# Patient Record
Sex: Male | Born: 1980 | Race: White | Hispanic: No | Marital: Married | Smoking: Current every day smoker
Health system: Southern US, Community
[De-identification: ages and names within clinical notes are randomized; demographics above are authoritative.]

## PROBLEM LIST (undated history)

## (undated) HISTORY — PX: BRAIN AVM REPAIR: SHX202

---

## 2003-09-20 ENCOUNTER — Emergency Department (HOSPITAL_COMMUNITY): Admission: EM | Admit: 2003-09-20 | Discharge: 2003-09-20 | Payer: Self-pay | Admitting: Emergency Medicine

## 2004-03-01 ENCOUNTER — Emergency Department (HOSPITAL_COMMUNITY): Admission: EM | Admit: 2004-03-01 | Discharge: 2004-03-01 | Payer: Self-pay | Admitting: Emergency Medicine

## 2004-06-13 ENCOUNTER — Ambulatory Visit (HOSPITAL_COMMUNITY): Admission: RE | Admit: 2004-06-13 | Discharge: 2004-06-13 | Payer: Self-pay | Admitting: Family Medicine

## 2004-06-25 ENCOUNTER — Emergency Department (HOSPITAL_COMMUNITY): Admission: EM | Admit: 2004-06-25 | Discharge: 2004-06-25 | Payer: Self-pay | Admitting: Emergency Medicine

## 2004-07-07 ENCOUNTER — Ambulatory Visit: Payer: Self-pay | Admitting: Orthopedic Surgery

## 2004-07-25 ENCOUNTER — Encounter (HOSPITAL_COMMUNITY): Admission: RE | Admit: 2004-07-25 | Discharge: 2004-08-24 | Payer: Self-pay | Admitting: Orthopedic Surgery

## 2004-08-13 ENCOUNTER — Emergency Department (HOSPITAL_COMMUNITY): Admission: EM | Admit: 2004-08-13 | Discharge: 2004-08-13 | Payer: Self-pay | Admitting: Emergency Medicine

## 2004-11-06 ENCOUNTER — Emergency Department (HOSPITAL_COMMUNITY): Admission: EM | Admit: 2004-11-06 | Discharge: 2004-11-06 | Payer: Self-pay | Admitting: Emergency Medicine

## 2005-03-22 ENCOUNTER — Emergency Department (HOSPITAL_COMMUNITY): Admission: EM | Admit: 2005-03-22 | Discharge: 2005-03-22 | Payer: Self-pay | Admitting: Emergency Medicine

## 2006-06-19 ENCOUNTER — Emergency Department (HOSPITAL_COMMUNITY): Admission: EM | Admit: 2006-06-19 | Discharge: 2006-06-19 | Payer: Self-pay | Admitting: Emergency Medicine

## 2006-08-19 ENCOUNTER — Emergency Department (HOSPITAL_COMMUNITY): Admission: EM | Admit: 2006-08-19 | Discharge: 2006-08-19 | Payer: Self-pay | Admitting: Emergency Medicine

## 2006-10-21 ENCOUNTER — Emergency Department (HOSPITAL_COMMUNITY): Admission: EM | Admit: 2006-10-21 | Discharge: 2006-10-21 | Payer: Self-pay | Admitting: Emergency Medicine

## 2007-08-19 ENCOUNTER — Encounter: Payer: Self-pay | Admitting: Orthopedic Surgery

## 2007-09-02 ENCOUNTER — Encounter: Payer: Self-pay | Admitting: Orthopedic Surgery

## 2008-12-09 ENCOUNTER — Emergency Department (HOSPITAL_COMMUNITY): Admission: EM | Admit: 2008-12-09 | Discharge: 2008-12-09 | Payer: Self-pay | Admitting: Emergency Medicine

## 2009-09-13 ENCOUNTER — Emergency Department (HOSPITAL_COMMUNITY): Admission: EM | Admit: 2009-09-13 | Discharge: 2009-09-13 | Payer: Self-pay | Admitting: Emergency Medicine

## 2010-04-24 ENCOUNTER — Encounter: Payer: Self-pay | Admitting: Family Medicine

## 2010-10-12 IMAGING — CT CT HEAD W/O CM
1 series · 16 of 30 positions shown, 20 images · non-contrast
Comparison: CT head 11/06/2004.

CLINICAL DATA: Right-sided headache.  History of AV malformation
and repair.  Seizure disorder.

CT HEAD WITHOUT CONTRAST
TECHNIQUE: Contiguous axial images were obtained from the base of
the skull through the vertex without contrast.

[Series 2: headseq 4.8 h37s · axial · 0.46mm/px · z∈[+110,+265]mm · 16 of 36 slices shown, 20 images]
[im 2/36  brain]
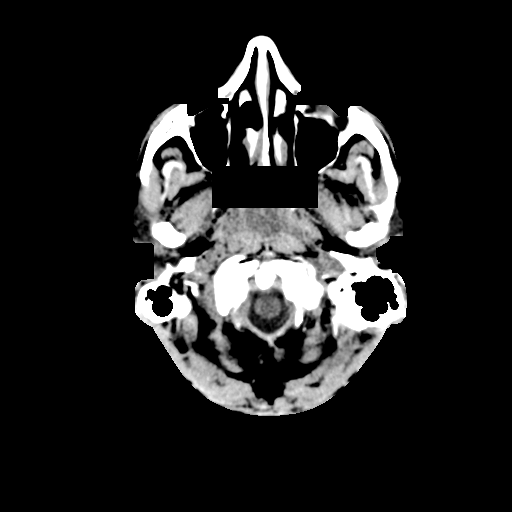
[im 2/36  bone]
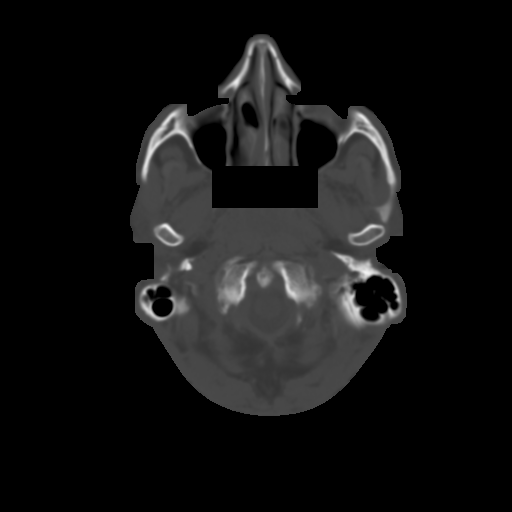
[im 4/36  brain]
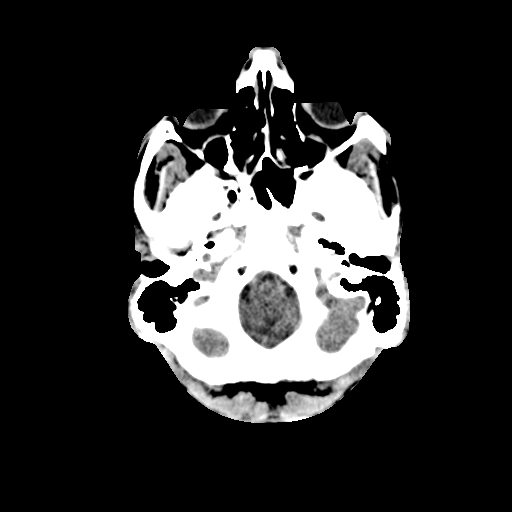
[im 7/36  brain]
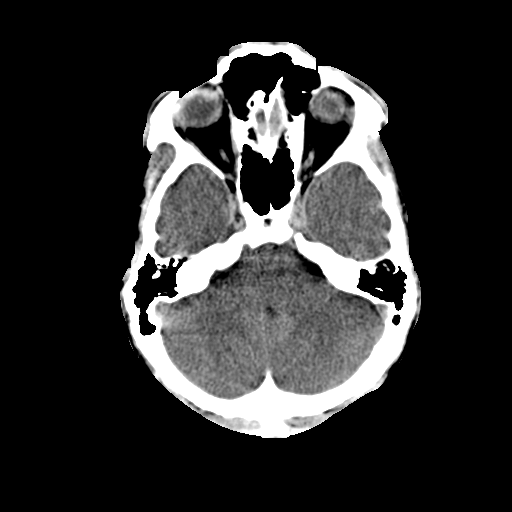
[im 9/36  brain]
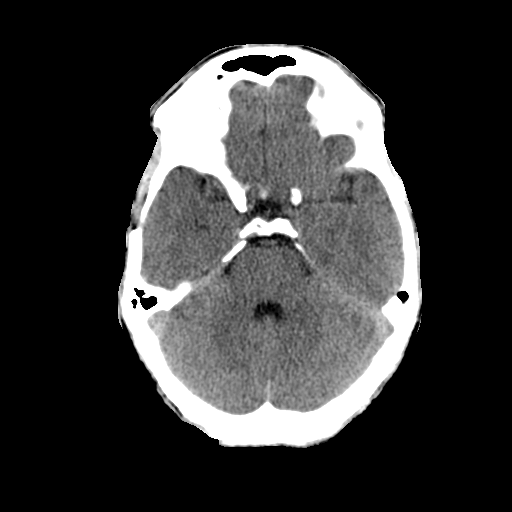
[im 10/36  brain]
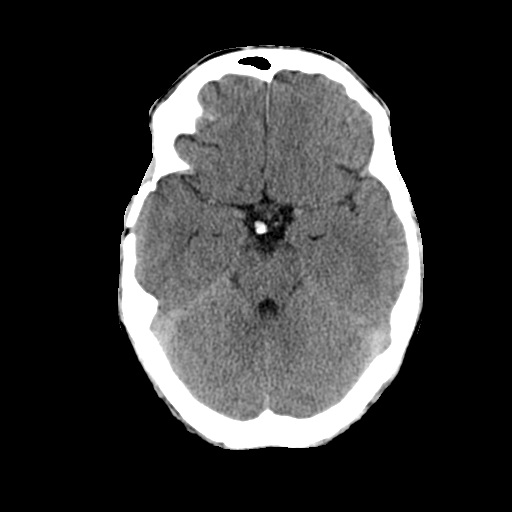
[im 10/36  bone]
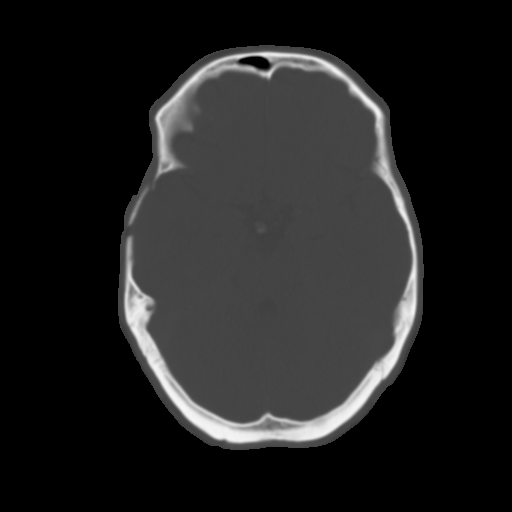
[im 13/36  brain]
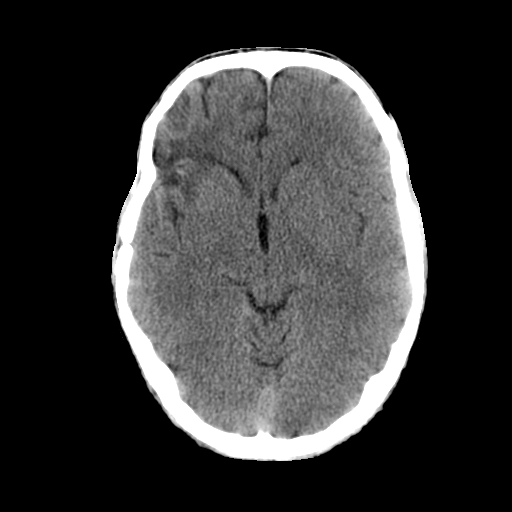
[im 15/36  brain]
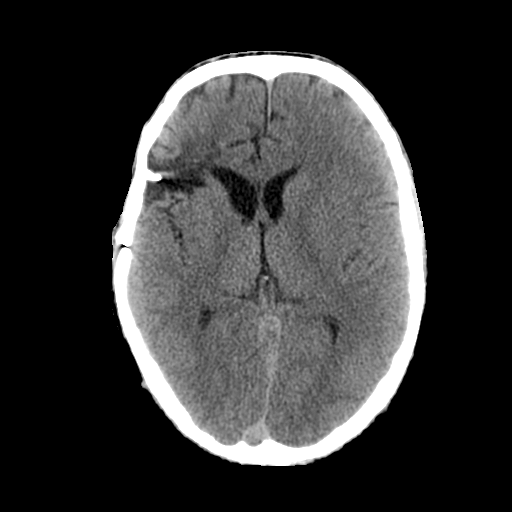
[im 17/36  brain]
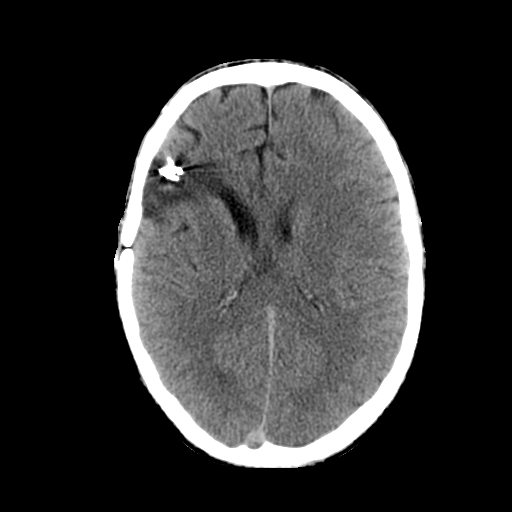
[im 19/36  brain]
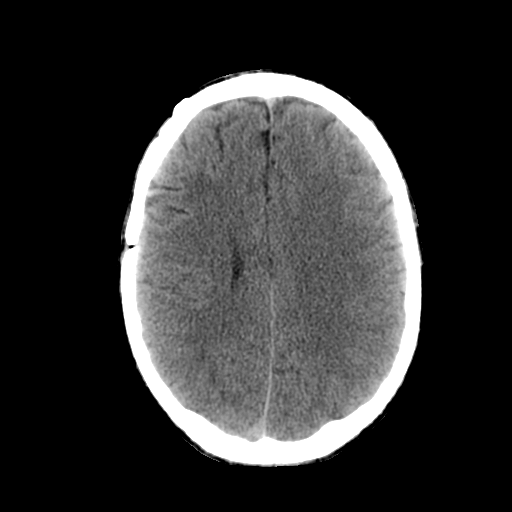
[im 19/36  bone]
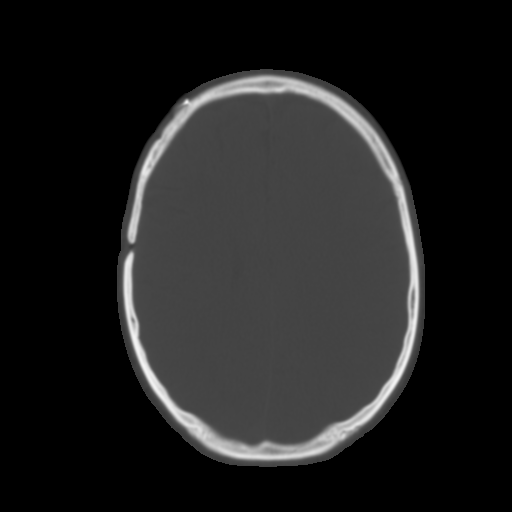
[im 21/36  brain]
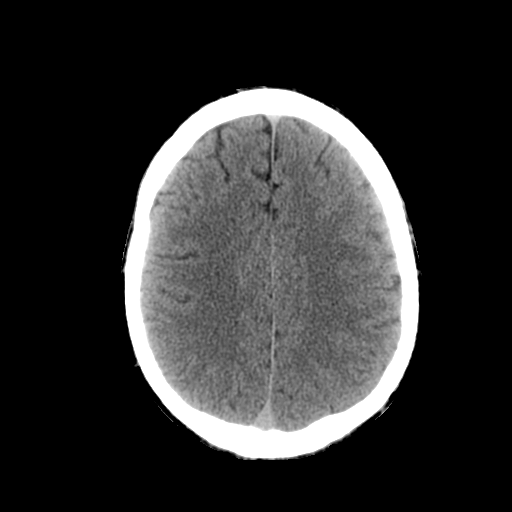
[im 23/36  brain]
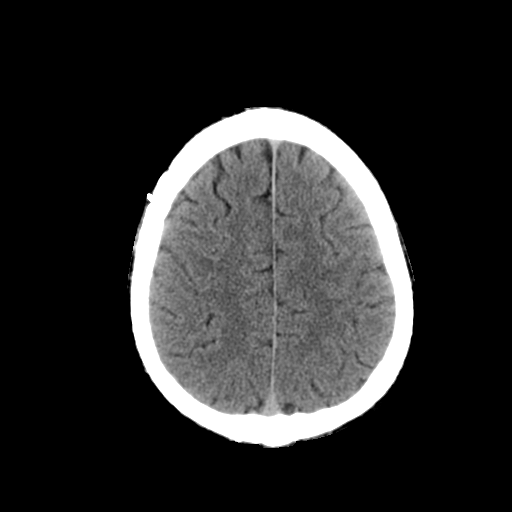
[im 26/36  brain]
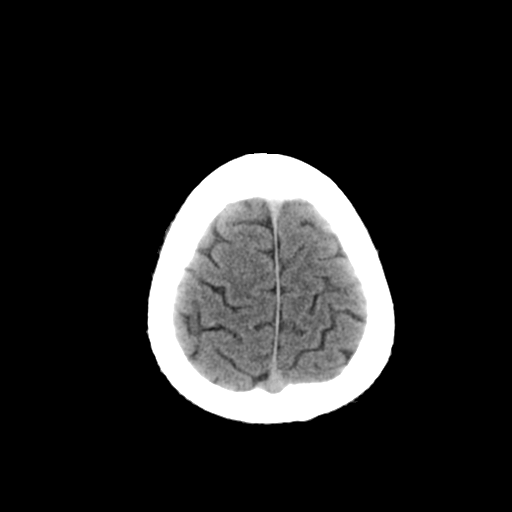
[im 27/36  brain]
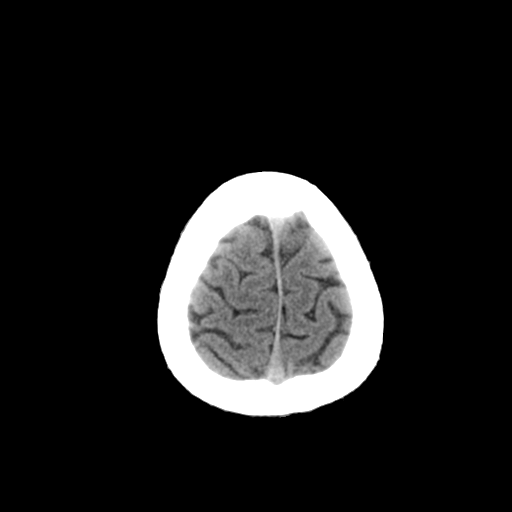
[im 27/36  bone]
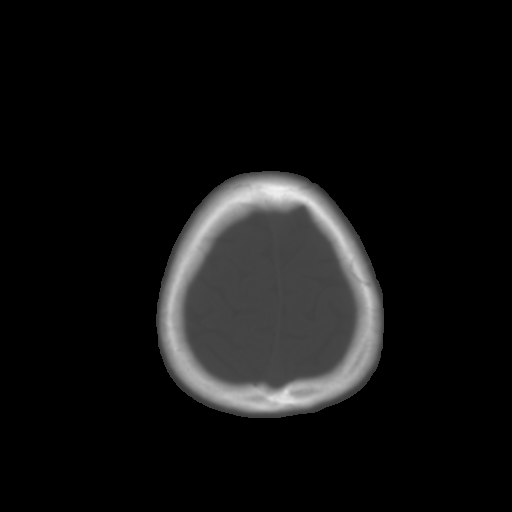
[im 29/36  brain]
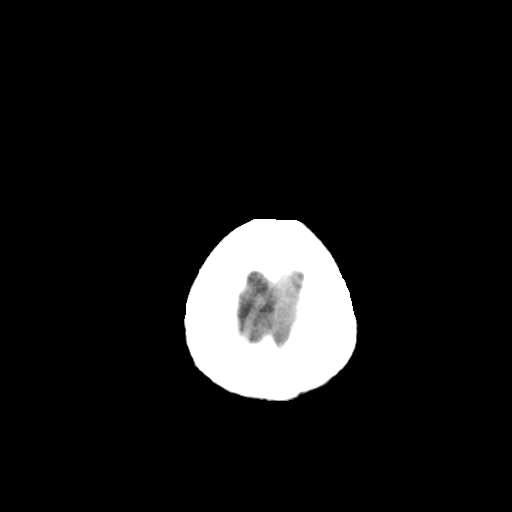
[im 32/36  brain]
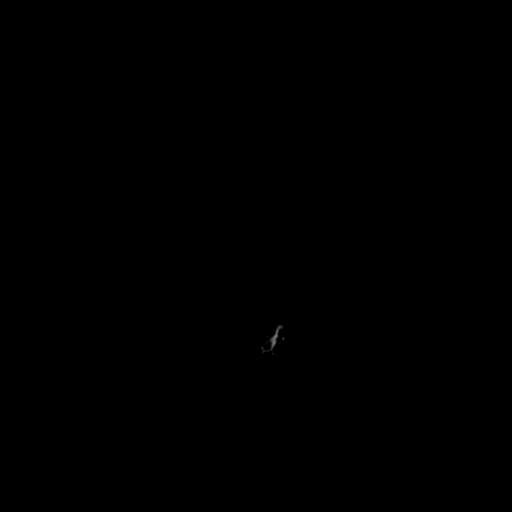
[im 34/36  brain]
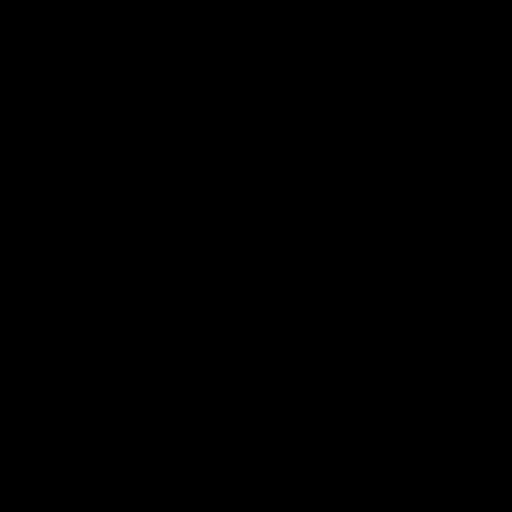

[16 of 30 positions shown; findings below may reference images not displayed]

FINDINGS: Postop the right frontal temporal craniotomy.  There is a
metal aneurysm clip in the right sylvian fissure.  There is
surrounding low density compatible with chronic encephalomalacia.
There is also some linear high density  within the encephalomalacia
which may be related to glue embolization.  No interval studies are

There is no acute hemorrhage.  No acute infarct is present.  The
ventricles are not enlarged.
IMPRESSION: Postsurgical changes in the right frontal lobe from prior aneurysm
clipping and embolization.  There is encephalomalacia in the right
frontal lobe.

No acute abnormality.

## 2016-03-19 ENCOUNTER — Emergency Department (HOSPITAL_COMMUNITY)
Admission: EM | Admit: 2016-03-19 | Discharge: 2016-03-19 | Disposition: A | Discharge status: Home / Self Care | Payer: Medicaid Other | Attending: Emergency Medicine | Admitting: Emergency Medicine

## 2016-03-19 ENCOUNTER — Encounter (HOSPITAL_COMMUNITY): Payer: Self-pay | Admitting: Emergency Medicine

## 2016-03-19 DIAGNOSIS — F17290 Nicotine dependence, other tobacco product, uncomplicated: Secondary | ICD-10-CM | POA: Insufficient documentation

## 2016-03-19 DIAGNOSIS — L02415 Cutaneous abscess of right lower limb: Secondary | ICD-10-CM | POA: Diagnosis not present

## 2016-03-19 DIAGNOSIS — L0291 Cutaneous abscess, unspecified: Secondary | ICD-10-CM

## 2016-03-19 MED ORDER — LIDOCAINE-EPINEPHRINE (PF) 2 %-1:200000 IJ SOLN
10.0000 mL | Freq: Once | INTRAMUSCULAR | Status: AC
  Administered 2016-03-19: 10 mL
  Filled 2016-03-19: qty 20

## 2016-03-19 NOTE — ED Triage Notes (Signed)
Pt reports abscess to right knee which he saw pcp for a few days ago and began abx.  States is no better.

## 2016-03-19 NOTE — Discharge Instructions (Signed)
It was our pleasure to provide your ER care today - we hope that you feel better.  Keep wound very clean.    May shower, and pad area gently dry.  Do not take bath or submerge wound.   Take tylenol/advil as need. Complete the course of your antibiotic.   Have wound check and packing removal, your doctor or urgent care, in 2 days time.   Return to ER if worse, spreading redness, worsening or severe pain, high fevers, other concern.

## 2016-03-19 NOTE — ED Notes (Signed)
Right knee red and swollen since Tuesday.

## 2016-03-19 NOTE — ED Provider Notes (Signed)
[facility] Provider Note   CSN: [id number] Arrival date & time: 03/19/16  1000 By signing my name below, I, Linus GalasMaharshi Patel, attest that this documentation has been prepared under the direction and in the presence of Cathren LaineKevin Kenya Kook, MD. Electronically Signed: Linus GalasMaharshi Patel, ED Scribe. 03/19/16. 10:16 AM.  History   Chief Complaint Chief Complaint  Patient presents with  . Abscess    The history is provided by the patient. No language interpreter was used.   HPI Comments: Kurt Cruz is a 35 y.o. male who presents to the Emergency Department with no pertinenet PMHx complaining of a right knee abscess with associated erythema and swelling that began a 5 days ago. Pt was seen by his PCP 4 days ago who prescribed the pt Bactrim which he has been complainant with ever since. However, he notes no improvements even after starting antibiotics. Pt denies any fevers, chills, N/V, or any other symptoms at this time.   History reviewed. No pertinent past medical history.  There are no active problems to display for this patient.   Past Surgical History:  Procedure Laterality Date  . BRAIN AVM REPAIR       Home Medications    Prior to Admission medications   Not on File    Family History History reviewed. No pertinent family history.  Social History Social History  Substance Use Topics  . Smoking status: Current Some Day Smoker    Types: Cigars  . Smokeless tobacco: Not on file  . Alcohol use No     Allergies   Patient has no known allergies.   Review of Systems Review of Systems  Constitutional: Negative for chills and fever.  Gastrointestinal: Negative for vomiting.  Skin: Positive for wound.   Physical Exam Updated Vital Signs BP 122/68 (BP Location: Right Arm)   Pulse 78   Temp 98.1 F (36.7 C) (Oral)   Resp 18   Ht 5\' 6"  (1.676 m)   Wt 150 lb (68 kg)   SpO2 99%   BMI 24.21 kg/m   Physical Exam  Constitutional: He appears well-developed and  well-nourished.  HENT:  Head: Normocephalic.  Eyes: Conjunctivae are normal.  Cardiovascular: Normal rate.   Pulmonary/Chest: Effort normal.  Musculoskeletal: He exhibits no edema.  Abscess just inferior/lateral to right knee. No cellulitis. There is not pain w passive rom right knee, no findings to suggest septic knee joint.   Neurological: He is alert.  Skin: Skin is warm and dry. No rash noted.  Small abscess to the right knee  Psychiatric: He has a normal mood and affect.  Nursing note and vitals reviewed.  ED Treatments / Results  DIAGNOSTIC STUDIES: Oxygen Saturation is 99% on room air, normal by my interpretation.    COORDINATION OF CARE: 10:07 AM Discussed treatment plan with pt at bedside including performing an I&D procedure. Pt agreed to plan.  Labs (all labs ordered are listed, but only abnormal results are displayed) Labs Reviewed - No data to display  EKG  EKG Interpretation None       Radiology No results found.  Procedures .Marland Kitchen.Incision and Drainage Date/Time: 03/19/2016 10:51 AM Performed by: Cathren LaineSTEINL, Branko Steeves Authorized by: Cathren LaineSTEINL, Willet Schleifer   Consent:    Consent obtained:  Verbal   Consent given by:  Patient Location:    Type:  Abscess   Size:  3-4 cm diam   Location:  Lower extremity   Lower extremity location:  Knee   Knee location:  R knee Pre-procedure details:  Skin preparation:  Betadine Anesthesia (see MAR for exact dosages):    Anesthesia method:  Local infiltration   Local anesthetic:  Lidocaine 2% WITH epi Procedure type:    Complexity:  Complex Procedure details:    Incision depth:  Subcutaneous   Scalpel blade:  10   Wound management:  Probed and deloculated   Drainage:  Purulent   Drainage amount:  Moderate   Wound treatment:  Wound left open and drain placed   Packing materials:  1/4 in gauze Post-procedure details:    Patient tolerance of procedure:  Tolerated well, no immediate complications   (including critical care  time)  Medications Ordered in ED Medications - No data to display   Initial Impression / Assessment and Plan / ED Course  I have reviewed the triage vital signs and the nursing notes.  Pertinent labs & imaging results that were available during my care of the patient were reviewed by me and considered in my medical decision making (see chart for details).  Clinical Course     I and D of abscess performed.   No finding of septic joint on exam.  Pt indicates has 1 week abx left.   Sterile dressing.   Patient appears comfortable, and stable for d/c.   Final Clinical Impressions(s) / ED Diagnoses   Final diagnoses:  None    New Prescriptions New Prescriptions   No medications on file   I personally performed the services described in this documentation, which was scribed in my presence. The recorded information has been reviewed and considered. Cathren LaineKevin Tyla Burgner, MD     Cathren LaineKevin Madailein Londo, MD 03/19/16 [phone]

## 2016-11-10 ENCOUNTER — Emergency Department (HOSPITAL_COMMUNITY)
Admission: EM | Admit: 2016-11-10 | Discharge: 2016-11-10 | Disposition: A | Discharge status: Home / Self Care | Payer: Worker's Compensation | Attending: Emergency Medicine | Admitting: Emergency Medicine

## 2016-11-10 ENCOUNTER — Emergency Department (HOSPITAL_COMMUNITY): Payer: Worker's Compensation

## 2016-11-10 ENCOUNTER — Encounter (HOSPITAL_COMMUNITY): Payer: Self-pay | Admitting: Emergency Medicine

## 2016-11-10 DIAGNOSIS — S62664A Nondisplaced fracture of distal phalanx of right ring finger, initial encounter for closed fracture: Secondary | ICD-10-CM | POA: Diagnosis not present

## 2016-11-10 DIAGNOSIS — W230XXA Caught, crushed, jammed, or pinched between moving objects, initial encounter: Secondary | ICD-10-CM | POA: Diagnosis not present

## 2016-11-10 DIAGNOSIS — S61314A Laceration without foreign body of right ring finger with damage to nail, initial encounter: Secondary | ICD-10-CM | POA: Insufficient documentation

## 2016-11-10 DIAGNOSIS — Y9289 Other specified places as the place of occurrence of the external cause: Secondary | ICD-10-CM | POA: Insufficient documentation

## 2016-11-10 DIAGNOSIS — S61316A Laceration without foreign body of right little finger with damage to nail, initial encounter: Secondary | ICD-10-CM | POA: Insufficient documentation

## 2016-11-10 DIAGNOSIS — Y939 Activity, unspecified: Secondary | ICD-10-CM | POA: Diagnosis not present

## 2016-11-10 DIAGNOSIS — S61309A Unspecified open wound of unspecified finger with damage to nail, initial encounter: Secondary | ICD-10-CM

## 2016-11-10 DIAGNOSIS — F17290 Nicotine dependence, other tobacco product, uncomplicated: Secondary | ICD-10-CM | POA: Insufficient documentation

## 2016-11-10 DIAGNOSIS — Y990 Civilian activity done for income or pay: Secondary | ICD-10-CM | POA: Insufficient documentation

## 2016-11-10 DIAGNOSIS — S62666A Nondisplaced fracture of distal phalanx of right little finger, initial encounter for closed fracture: Secondary | ICD-10-CM

## 2016-11-10 DIAGNOSIS — Z23 Encounter for immunization: Secondary | ICD-10-CM | POA: Insufficient documentation

## 2016-11-10 DIAGNOSIS — S6991XA Unspecified injury of right wrist, hand and finger(s), initial encounter: Secondary | ICD-10-CM | POA: Diagnosis present

## 2016-11-10 MED ORDER — HYDROCODONE-ACETAMINOPHEN 5-325 MG PO TABS: 1.0000 | ORAL_TABLET | ORAL | 0 refills | Status: AC | PRN

## 2016-11-10 MED ORDER — IBUPROFEN 600 MG PO TABS
600.0000 mg | ORAL_TABLET | Freq: Four times a day (QID) | ORAL | 0 refills | Status: AC | PRN
Start: 2016-11-10 — End: ?

## 2016-11-10 MED ORDER — LIDOCAINE HCL (PF) 1 % IJ SOLN
INTRAMUSCULAR | Status: AC
  Administered 2016-11-10: 5 mL
  Filled 2016-11-10: qty 5

## 2016-11-10 MED ORDER — LIDOCAINE HCL (PF) 2 % IJ SOLN: 5.0000 mL | Freq: Once | INTRAMUSCULAR | Status: DC

## 2016-11-10 MED ORDER — TETANUS-DIPHTH-ACELL PERTUSSIS 5-2.5-18.5 LF-MCG/0.5 IM SUSP
0.5000 mL | Freq: Once | INTRAMUSCULAR | Status: AC
  Administered 2016-11-10: 0.5 mL via INTRAMUSCULAR
  Filled 2016-11-10: qty 0.5

## 2016-11-10 MED ORDER — HYDROCODONE-ACETAMINOPHEN 5-325 MG PO TABS
1.0000 | ORAL_TABLET | Freq: Once | ORAL | Status: AC
  Administered 2016-11-10: 1 via ORAL
  Filled 2016-11-10: qty 1

## 2016-11-10 MED ORDER — CEPHALEXIN 500 MG PO CAPS: 500.0000 mg | ORAL_CAPSULE | Freq: Four times a day (QID) | ORAL | 0 refills | Status: AC

## 2016-11-10 MED ORDER — POVIDONE-IODINE 10 % EX SOLN
CUTANEOUS | Status: AC
  Administered 2016-11-10: 21:00:00
  Filled 2016-11-10: qty 45

## 2016-11-10 NOTE — ED Notes (Signed)
Xeroform applied followed by cling and then finger splints to 4th and 5th fingers of right hand.

## 2016-11-10 NOTE — ED Notes (Signed)
Fingers soaked in betadine and saline soluation

## 2016-11-10 NOTE — ED Triage Notes (Signed)
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

## 2016-11-10 NOTE — Discharge Instructions (Signed)
Keep your fingers clean, dry and covered, using the splint to protect the broken bones.  Starting tomorrow evening you may change the dressings twice daily and also check for any signs of infection as discussed.  It is unclear how well your new nail on the 4th finger will grow back, it may be fine but will take some time to determine this.  You may take the hydrocodone prescribed for pain relief.  This will make you drowsy - do not drive within 4 hours of taking this medication.  Ice and elevation will also help with pain.  Your tetanus has been updated today.  Take the antibiotic as prescribed to help minimize infection in this wound.

## 2016-11-11 NOTE — ED Provider Notes (Signed)
[facility] Provider Note   CSN: [id number] Arrival date & time: 11/10/16  1914     History   Chief Complaint Chief Complaint  Patient presents with  . Hand Pain    HPI Kurt Cruz is a 36 y.o. right handed male presenting with right 4th and 5th finger crush injuries incurred about 5 hours before arrival at work.  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  He states the wounds bled copiously but improved with pressure and dressings.  He did not feel his injury was "too bad" and presents here now at his wife's insistence.  He has taken tylenol prior to arrival with improvement in pain.  He denies other injuries and denies numbness in his fingertips. He is unsure of his tetanus status.  The history is provided by the patient.    History reviewed. No pertinent past medical history.  There are no active problems to display for this patient.   Past Surgical History:  Procedure Laterality Date  . BRAIN AVM REPAIR         Home Medications    Prior to Admission medications   Medication Sig Start Date End Date Taking? Authorizing Provider  cephALEXin (KEFLEX) 500 MG capsule Take 1 capsule (500 mg total) by mouth 4 (four) times daily. 11/10/16   Burgess AmorIdol, Saffron Busey, PA-C  HYDROcodone-acetaminophen (NORCO/VICODIN) 5-325 MG tablet Take 1 tablet by mouth every 4 (four) hours as needed. 11/10/16   Burgess AmorIdol, Sue Mcalexander, PA-C  ibuprofen (ADVIL,MOTRIN) 600 MG tablet Take 1 tablet (600 mg total) by mouth every 6 (six) hours as needed. 11/10/16   Burgess AmorIdol, Donnarae Rae, PA-C    Family History No family history on file.  Social History Social History  Substance Use Topics  . Smoking status: Current Some Day Smoker    Types: Cigars  . Smokeless tobacco: Never Used  . Alcohol use No     Allergies   Patient has no known allergies.   Review of Systems Review of Systems  Constitutional: Negative for fever.  Musculoskeletal: Positive for  arthralgias. Negative for myalgias.  Skin: Positive for wound.  Neurological: Negative for weakness and numbness.     Physical Exam Updated Vital Signs BP 114/76   Pulse 88   Temp 98.1 F (36.7 C)   Resp 15   Ht 5\' 6"  (1.676 m)   Wt 59 kg (130 lb)   SpO2 96%   BMI 20.98 kg/m   Physical Exam  Constitutional: He is oriented to person, place, and time. He appears well-developed and well-nourished.  HENT:  Head: Normocephalic.  Cardiovascular: Normal rate.   Pulmonary/Chest: Effort normal.  Musculoskeletal: He exhibits tenderness. He exhibits no deformity.  Superficial sloughing of the entire volar distal right 5th phalanx, flap still present, nail attached, loose to the mid nail bed, firmly attached proximally with trace bleeding.  4th phalanx with similar superficial sloughing of distal finger tip, nail plate avulsed, hemostatic. Marjority of nail bed appears intact, there is a lateral proximal aspect that appears to have a deep avulsion. No suturable lacerations present.   Distal sensation intact.  Proximal fingers and hand nontender.  Neurological: He is alert and oriented to person, place, and time. No sensory deficit.  Skin: Laceration noted.     ED Treatments / Results  Labs (all labs ordered are listed, but only abnormal results are displayed) Labs Reviewed - No data to display  EKG  EKG Interpretation None  Radiology Dg Hand Complete Right  Result Date: 11/10/2016 CLINICAL DATA:  Crush injury to the main and little finger. EXAM: RIGHT HAND - COMPLETE 3+ VIEW COMPARISON:  None. FINDINGS: Acute comminuted closed fractures of the fourth and fifth distal phalanges with slightly macerated appearance of the soft tissues overlying the fractures. No significant displacement or angulation. More marked fracture involvement of the tuft of the right fifth digit is noted. No intra-articular extension is seen. IMPRESSION: 1. Acute, closed, comminuted and nondisplaced  fractures of the fourth and fifth distal phalanges and tufts. No intra-articular involvement is identified. 2. Soft tissue irregularity and swelling is noted of soft tissues overlying the fractures. Electronically Signed   By: Tollie Eth M.D.   On: 11/10/2016 20:23    Procedures Procedures (including critical care time)  Pt's fingers were soaked in betadine/saline rinse after digital blocks performed.  Xeroform, cling and finger splints applied.    NERVE BLOCK Performed by: Burgess Amor Consent: Verbal consent obtained. Required items: required blood products, implants, devices, and special equipment available Time out: Immediately prior to procedure a "time out" was called to verify the correct patient, procedure, equipment, support staff and site/side marked as required.  Indication: pain relief Nerve block body site: digits 4 and 5 right hand  Preparation: Patient was prepped and draped in the usual sterile fashion. Needle gauge: 25G Location technique: anatomical landmarks  Local anesthetic:lidocaine 1% without epi  Anesthetic total: 4 ml  Outcome: pain improved Patient tolerance: Patient tolerated the procedure well with no immediate complications.   Medications Ordered in ED Medications  Tdap (BOOSTRIX) injection 0.5 mL (0.5 mLs Intramuscular Given 11/10/16 2136)  HYDROcodone-acetaminophen (NORCO/VICODIN) 5-325 MG per tablet 1 tablet (1 tablet Oral Given 11/10/16 2001)  lidocaine (PF) (XYLOCAINE) 1 % injection (5 mLs  Given by Other 11/10/16 2106)  povidone-iodine (BETADINE) 10 % external solution (  Given 11/10/16 2107)     Initial Impression / Assessment and Plan / ED Course  I have reviewed the triage vital signs and the nursing notes.  Pertinent labs & imaging results that were available during my care of the patient were reviewed by me and considered in my medical decision making (see chart for details).     Imaging reviewed and discussed with pt.  He will need f/u  care, referral given for recheck early this week.  He was prescribed hydrocodone and keflex.  Discussed possible outcome of 4th nail plate, could return fully, partially, can't rule out deformity to new nail plate given deep avulsion of nail bed.  Prn f/u anticipated.  Tetanus updated.  Final Clinical Impressions(s) / ED Diagnoses   Final diagnoses:  Closed nondisplaced fracture of distal phalanx of right ring finger, initial encounter  Nail avulsion, finger, initial encounter  Closed nondisplaced fracture of distal phalanx of right little finger, initial encounter    New Prescriptions Discharge Medication List as of 11/10/2016  9:30 PM    START taking these medications   Details  cephALEXin (KEFLEX) 500 MG capsule Take 1 capsule (500 mg total) by mouth 4 (four) times daily., Starting Fri 11/10/2016, Print    HYDROcodone-acetaminophen (NORCO/VICODIN) 5-325 MG tablet Take 1 tablet by mouth every 4 (four) hours as needed., Starting Fri 11/10/2016, Print         Burgess Amor, PA-C 11/11/16 1724    Bethann Berkshire, MD 11/11/16 2332

## 2016-11-13 ENCOUNTER — Telehealth: Payer: Self-pay | Admitting: Orthopedic Surgery

## 2016-11-13 NOTE — Telephone Encounter (Signed)
I called back to patient to follow up; reached voice mail, left message.

## 2016-11-13 NOTE — Telephone Encounter (Signed)
Patient called (spouse had called in earlier today, 11/13/08) to relay he was in [facility] 11[facility]10/16, for work-related injury of non-displaced fracture of 2 fingers.  Due to worker's comp protocol, approval is needed.  Employer and/or insurer to call to discuss scheduling. Patient ph [phone]

## 2016-11-16 NOTE — Telephone Encounter (Signed)
Followed up again with patient as no response as of today, 11/16/16. Left voice message.

## 2018-09-13 IMAGING — CR DG HAND COMPLETE 3+V*R*
1 series · 3 of 3 positions shown · non-contrast
Comparison: None.

CLINICAL DATA: Crush injury to the main and little finger.

EXAM:
RIGHT HAND - COMPLETE 3+ VIEW

[Series 1: pa · 0.17mm/px · 3 of 3 slices shown]
[im 1/3]
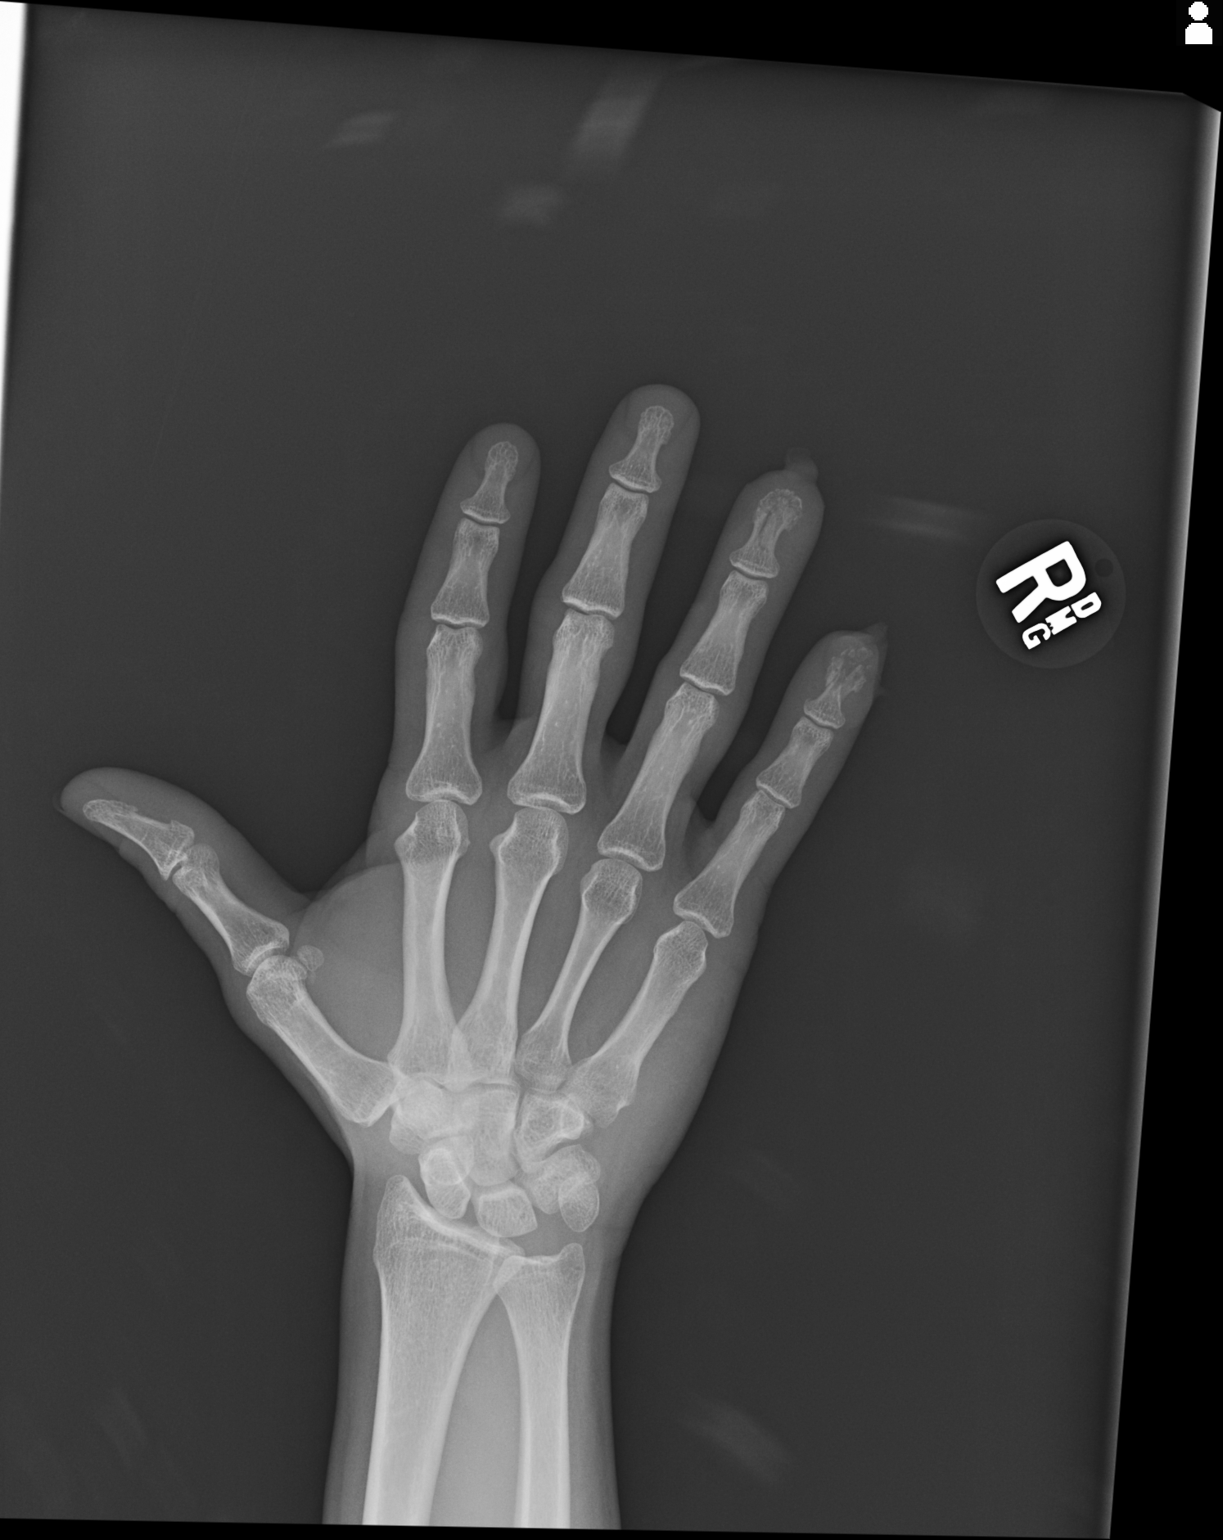
[im 2/3]
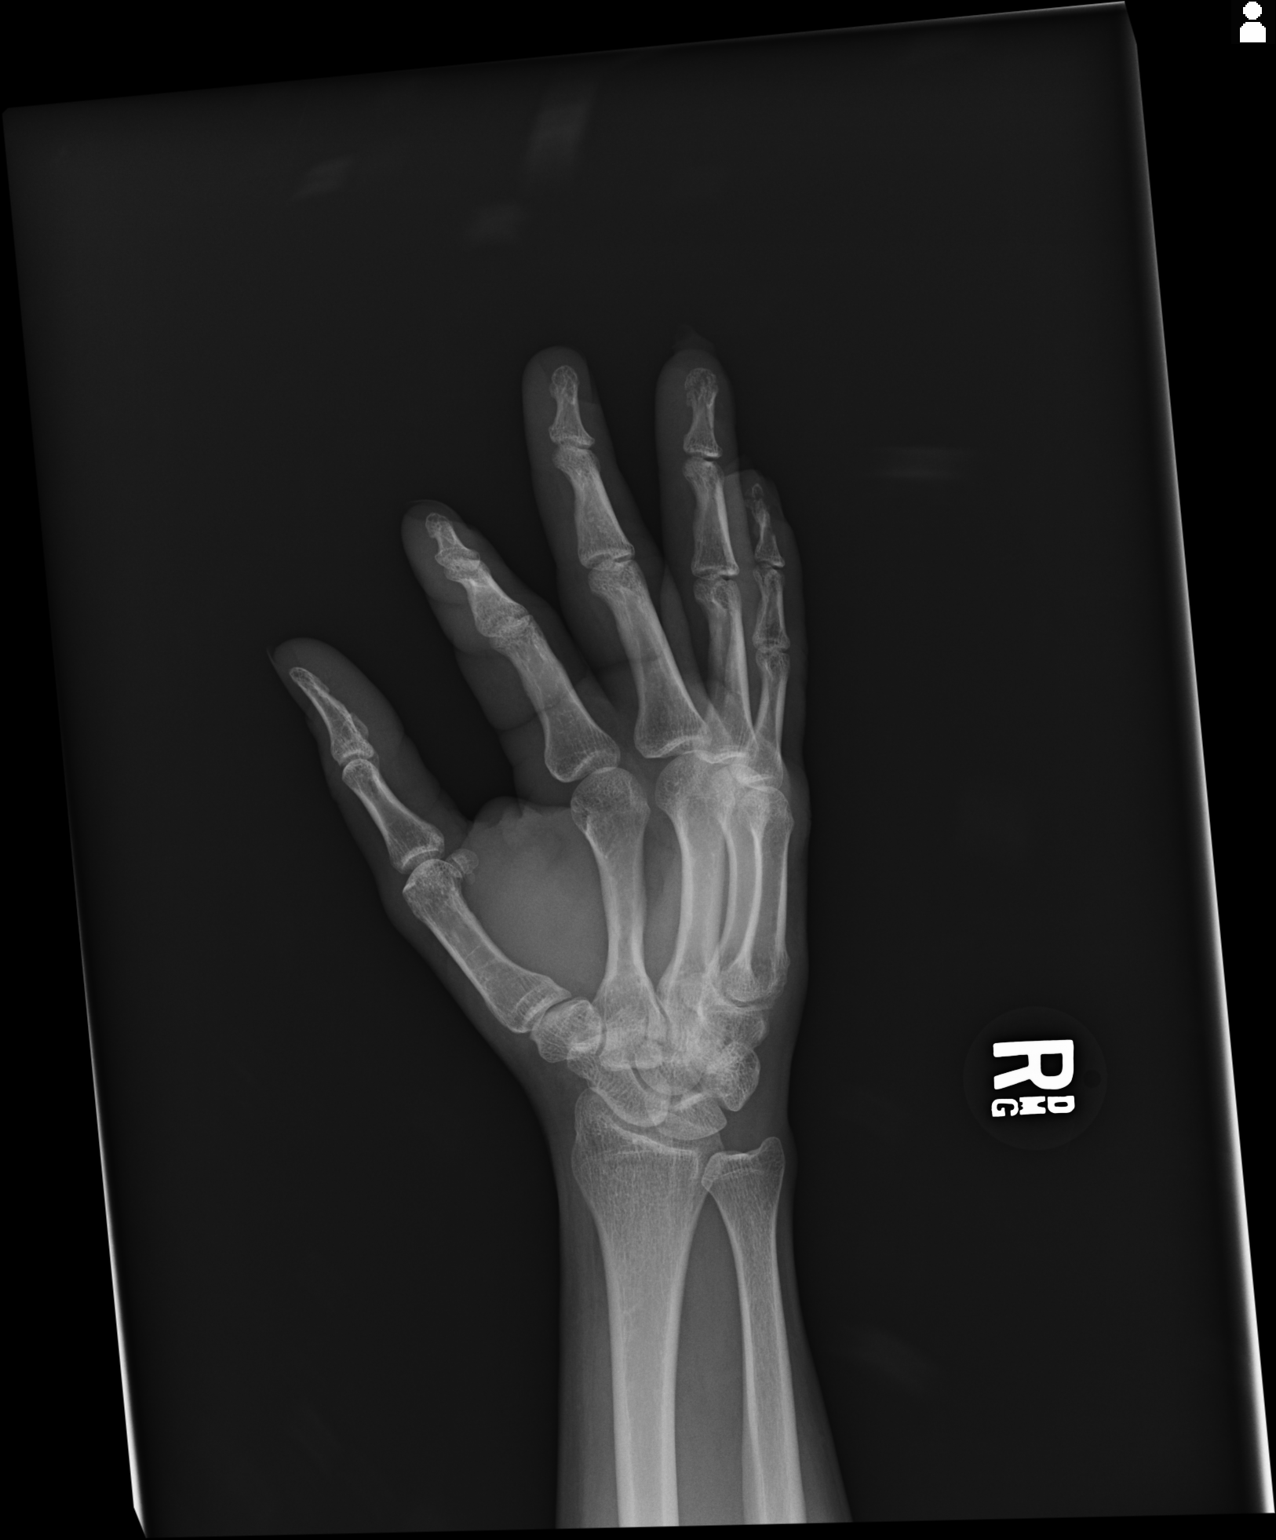
[im 3/3]
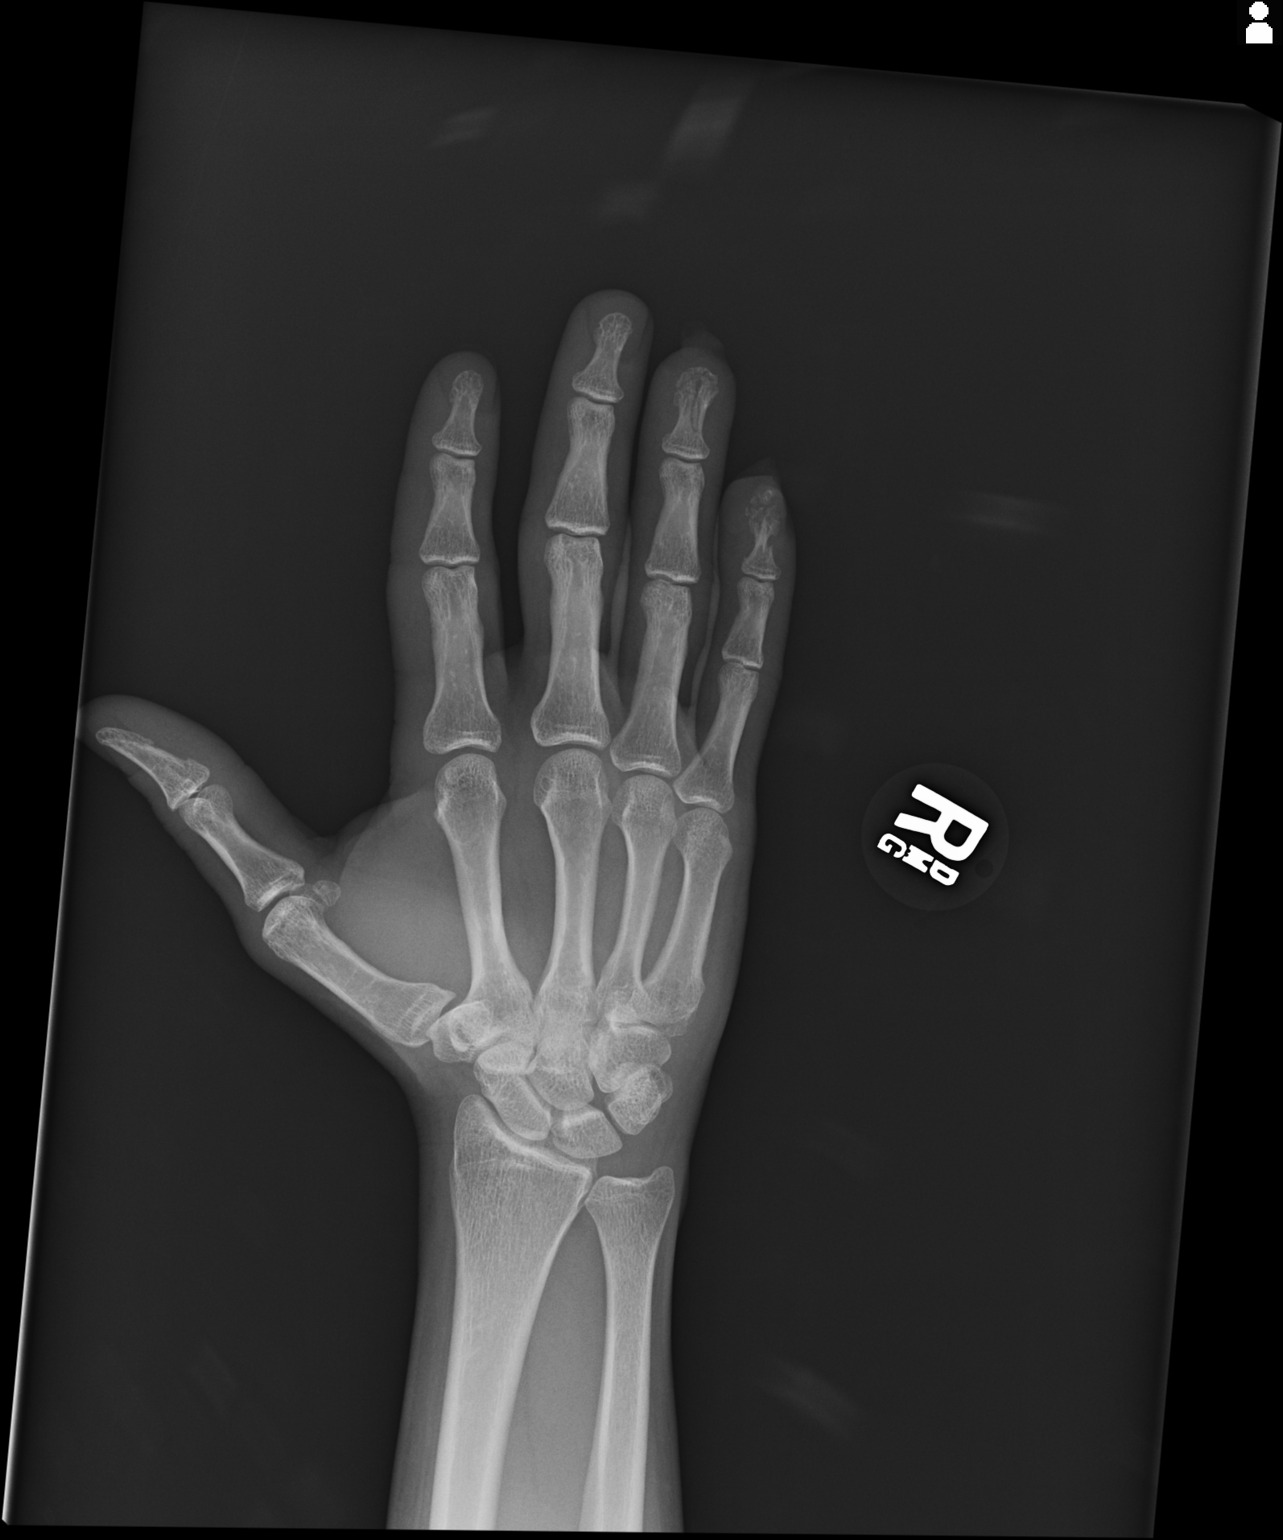

[3 of 3 positions shown; findings below may reference images not displayed]

FINDINGS: Acute comminuted closed fractures of the fourth and fifth distal
phalanges with slightly macerated appearance of the soft tissues
overlying the fractures. No significant displacement or angulation.
More marked fracture involvement of the tuft of the right fifth
digit is noted. No intra-articular extension is seen.
IMPRESSION: 1. Acute, closed, comminuted and nondisplaced fractures of the
fourth and fifth distal phalanges and [REDACTED]. No intra-articular
involvement is identified.
2. Soft tissue irregularity and swelling is noted of soft tissues
overlying the fractures.

## 2020-03-31 ENCOUNTER — Other Ambulatory Visit: Payer: Self-pay

## 2020-03-31 ENCOUNTER — Emergency Department (HOSPITAL_COMMUNITY)
Admission: EM | Admit: 2020-03-31 | Discharge: 2020-03-31 | Discharge status: Against Medical Advice | Payer: BLUE CROSS/BLUE SHIELD

## 2021-10-07 ENCOUNTER — Other Ambulatory Visit: Payer: Self-pay | Admitting: Family Medicine

## 2021-10-07 ENCOUNTER — Other Ambulatory Visit (HOSPITAL_COMMUNITY): Payer: Self-pay | Admitting: Family Medicine

## 2021-10-07 DIAGNOSIS — E039 Hypothyroidism, unspecified: Secondary | ICD-10-CM

## 2021-10-18 ENCOUNTER — Ambulatory Visit (HOSPITAL_COMMUNITY): Admission: RE | Admit: 2021-10-18 | Payer: PRIVATE HEALTH INSURANCE | Source: Ambulatory Visit

## 2021-10-18 ENCOUNTER — Encounter (HOSPITAL_COMMUNITY): Payer: Self-pay

## 2022-11-19 ENCOUNTER — Encounter (HOSPITAL_COMMUNITY): Payer: Self-pay | Admitting: Emergency Medicine

## 2022-11-19 ENCOUNTER — Emergency Department (HOSPITAL_COMMUNITY)
Admission: EM | Admit: 2022-11-19 | Discharge: 2022-11-19 | Disposition: A | Discharge status: Home / Self Care | Payer: Self-pay | Attending: Emergency Medicine | Admitting: Emergency Medicine

## 2022-11-19 ENCOUNTER — Emergency Department (HOSPITAL_COMMUNITY): Payer: Self-pay

## 2022-11-19 ENCOUNTER — Other Ambulatory Visit: Payer: Self-pay

## 2022-11-19 DIAGNOSIS — X58XXXA Exposure to other specified factors, initial encounter: Secondary | ICD-10-CM | POA: Insufficient documentation

## 2022-11-19 DIAGNOSIS — S39012A Strain of muscle, fascia and tendon of lower back, initial encounter: Secondary | ICD-10-CM | POA: Insufficient documentation

## 2022-11-19 LAB — CBC WITH DIFFERENTIAL/PLATELET
Abs Immature Granulocytes: 0.02 10*3/uL (ref 0.00–0.07)
Basophils Absolute: 0.1 10*3/uL (ref 0.0–0.1)
Basophils Relative: 1 %
Eosinophils Absolute: 0.3 10*3/uL (ref 0.0–0.5)
Eosinophils Relative: 4 %
HCT: 46.9 % (ref 39.0–52.0)
Hemoglobin: 15.9 g/dL (ref 13.0–17.0)
Immature Granulocytes: 0 %
Lymphocytes Relative: 28 %
Lymphs Abs: 1.8 10*3/uL (ref 0.7–4.0)
MCH: 31.5 pg (ref 26.0–34.0)
MCHC: 33.9 g/dL (ref 30.0–36.0)
MCV: 92.9 fL (ref 80.0–100.0)
Monocytes Absolute: 0.8 10*3/uL (ref 0.1–1.0)
Monocytes Relative: 11 %
Neutro Abs: 3.7 10*3/uL (ref 1.7–7.7)
Neutrophils Relative %: 56 %
Platelets: 256 10*3/uL (ref 150–400)
RBC: 5.05 MIL/uL (ref 4.22–5.81)
RDW: 13 % (ref 11.5–15.5)
WBC: 6.6 10*3/uL (ref 4.0–10.5)
nRBC: 0 % (ref 0.0–0.2)

## 2022-11-19 LAB — COMPREHENSIVE METABOLIC PANEL
ALT: 27 U/L (ref 0–44)
AST: 20 U/L (ref 15–41)
Albumin: 3.9 g/dL (ref 3.5–5.0)
Alkaline Phosphatase: 74 U/L (ref 38–126)
Anion gap: 7 (ref 5–15)
BUN: 11 mg/dL (ref 6–20)
CO2: 27 mmol/L (ref 22–32)
Calcium: 9.3 mg/dL (ref 8.9–10.3)
Chloride: 102 mmol/L (ref 98–111)
Creatinine, Ser: 0.72 mg/dL (ref 0.61–1.24)
GFR, Estimated: >60 mL/min (ref >60)
Glucose, Bld: 89 mg/dL (ref 70–99)
Potassium: 4.5 mmol/L (ref 3.5–5.1)
Sodium: 136 mmol/L (ref 135–145)
Total Bilirubin: 1 mg/dL (ref 0.3–1.2)
Total Protein: 8 g/dL (ref 6.5–8.1)

## 2022-11-19 LAB — URINALYSIS, ROUTINE W REFLEX MICROSCOPIC
Bacteria, UA: NONE SEEN
Bilirubin Urine: NEGATIVE
Glucose, UA: NEGATIVE mg/dL
Hgb urine dipstick: NEGATIVE
Ketones, ur: NEGATIVE mg/dL
Nitrite: NEGATIVE
Protein, ur: NEGATIVE mg/dL
Specific Gravity, Urine: 1.019 (ref 1.005–1.030)
pH: 6 (ref 5.0–8.0)

## 2022-11-19 LAB — LIPASE, BLOOD: Lipase: 37 U/L (ref 11–51)

## 2022-11-19 MED ORDER — LIDOCAINE 5 % EX PTCH: 1.0000 | MEDICATED_PATCH | CUTANEOUS | 0 refills | Status: AC

## 2022-11-19 MED ORDER — ETODOLAC 400 MG PO TABS: 400.0000 mg | ORAL_TABLET | Freq: Two times a day (BID) | ORAL | 0 refills | Status: AC

## 2022-11-19 MED ORDER — DIAZEPAM 2 MG PO TABS
2.0000 mg | ORAL_TABLET | Freq: Once | ORAL | Status: AC
  Administered 2022-11-19: 2 mg via ORAL
  Filled 2022-11-19: qty 1

## 2022-11-19 MED ORDER — LIDOCAINE 5 % EX PTCH
1.0000 | MEDICATED_PATCH | CUTANEOUS | Status: DC
  Administered 2022-11-19: 1 via TRANSDERMAL
  Filled 2022-11-19: qty 1

## 2022-11-19 MED ORDER — KETOROLAC TROMETHAMINE 15 MG/ML IJ SOLN
15.0000 mg | Freq: Once | INTRAMUSCULAR | Status: AC
  Administered 2022-11-19: 15 mg via INTRAMUSCULAR
  Filled 2022-11-19: qty 1

## 2022-11-19 MED ORDER — METHOCARBAMOL 500 MG PO TABS: 500.0000 mg | ORAL_TABLET | Freq: Two times a day (BID) | ORAL | 0 refills | Status: AC

## 2022-11-19 NOTE — ED Provider Notes (Signed)
[name] EMERGENCY DEPARTMENT AT [facility] Provider Note   CSN: [id number] Arrival date & time: 11/19/22  4098     History  Chief Complaint  Patient presents with   Back Pain    Kurt Cruz is a 42 y.o. male.  42 year old male presents today for concern of left low back pain.  Denies any issues with chronic neck pain.  He is a [employer].  States he occasionally gets back pain however this time it has been worse.  Denies any dysuria, hematuria, fever, nausea, or vomiting.  No prior history of kidney stones.  He has not taken anything for this prior to arrival.  The history is provided by the patient. No language interpreter was used.       Home Medications Prior to Admission medications   Medication Sig Start Date End Date Taking? Authorizing Provider  cephALEXin (KEFLEX) 500 MG capsule Take 1 capsule (500 mg total) by mouth 4 (four) times daily. 11/10/16   Burgess Amor, PA-C  HYDROcodone-acetaminophen (NORCO/VICODIN) 5-325 MG tablet Take 1 tablet by mouth every 4 (four) hours as needed. 11/10/16   Burgess Amor, PA-C  ibuprofen (ADVIL,MOTRIN) 600 MG tablet Take 1 tablet (600 mg total) by mouth every 6 (six) hours as needed. 11/10/16   Burgess Amor, PA-C      Allergies    Patient has no known allergies.    Review of Systems   Review of Systems  Constitutional:  Negative for fever.  Gastrointestinal:  Negative for abdominal pain, nausea and vomiting.  Genitourinary:  Positive for flank pain. Negative for dysuria and hematuria.  All other systems reviewed and are negative.   Physical Exam Updated Vital Signs BP 134/84   Pulse 60   Temp 97.8 F (36.6 C) (Oral)   Ht 5\' 6"  (1.676 m)   Wt 70.3 kg   SpO2 96%   BMI 25.02 kg/m  Physical Exam Vitals and nursing note reviewed.  Constitutional:      General: He is not in acute distress.    Appearance: Normal appearance. He is not ill-appearing.  HENT:     Head: Normocephalic and atraumatic.     Nose: Nose  normal.  Eyes:     Conjunctiva/sclera: Conjunctivae normal.  Pulmonary:     Effort: Pulmonary effort is normal. No respiratory distress.  Abdominal:     General: There is no distension.     Palpations: Abdomen is soft.     Tenderness: There is no abdominal tenderness. There is no right CVA tenderness, left CVA tenderness or guarding.  Musculoskeletal:        General: No deformity. Normal range of motion.     Comments: Left lumbar paraspinal muscle tenderness to palpation.  No midline tenderness to palpation or step-offs.  Ambulates without much difficulty.  Skin:    Findings: No rash.  Neurological:     Mental Status: He is alert.     ED Results / Procedures / Treatments   Labs (all labs ordered are listed, but only abnormal results are displayed) Labs Reviewed - No data to display  EKG None  Radiology No results found.  Procedures Procedures    Medications Ordered in ED Medications  diazepam (VALIUM) tablet 2 mg (has no administration in time range)  lidocaine (LIDODERM) 5 % 1 patch (has no administration in time range)  ketorolac (TORADOL) 15 MG/ML injection 15 mg (has no administration in time range)    ED Course/ Medical Decision Making/ A&P  Medical Decision Making Amount and/or Complexity of Data Reviewed Labs: ordered. Radiology: ordered.  Risk Prescription drug management.   42 year old male presents today for evaluation of left lower back pain.  No midline tenderness to palpation.  Exam overall reassuring.  Shared decision making had regarding blood work and CT imaging.  Patient prefers to proceed with the workup.  Does have some improvement with medications in the emergency department.  CT imaging without acute concerns.  CBC, CMP unremarkable.  Lipase within normal limit.  Patient discharged in stable condition. Return precautions discussed.  Robaxin prescribed.  Discussed not to drive or do anything else that is  dangerous after taking this medication.   Final Clinical Impression(s) / ED Diagnoses Final diagnoses:  Strain of lumbar region, initial encounter    Rx / DC Orders ED Discharge Orders          Ordered    etodolac (LODINE) 400 MG tablet  2 times daily        11/19/22 1330    methocarbamol (ROBAXIN) 500 MG tablet  2 times daily        11/19/22 1330    lidocaine (LIDODERM) 5 %  Every 24 hours        11/19/22 1330              Marita Kansas, PA-C 11/19/22 1333    Tanda Rockers A, DO 11/20/22 1255

## 2022-11-19 NOTE — Discharge Instructions (Signed)
Your workup today was reassuring.  No concerning findings on your blood work.  CT scan did not show anything concerning.  This is likely a muscle strain.  I have sent Lodine which is a strong anti-inflammatory medication, lidocaine patch, and Robaxin.  Robaxin is a muscle relaxer and will make you drowsy.  Do not do anything that is dangerous after taking this medication.  For any concerning symptoms return to the emergency room otherwise follow-up with your primary care provider.

## 2022-11-19 NOTE — ED Triage Notes (Signed)
Pt states his lower back has been hurting x3 days. Denies any injury.

## 2022-11-19 NOTE — ED Notes (Signed)
RN went to discharge pt and they were found to not be present. Family not present at bedside as well. Pt assumed to have left without discharge instructions.
